# Patient Record
Sex: Female | Born: 1950 | Race: White | Hispanic: No
Health system: Southern US, Community
[De-identification: ages and names within clinical notes are randomized; demographics above are authoritative.]

---

## 2004-02-12 ENCOUNTER — Ambulatory Visit (HOSPITAL_BASED_OUTPATIENT_CLINIC_OR_DEPARTMENT_OTHER): Admission: RE | Admit: 2004-02-12 | Discharge: 2004-02-12 | Payer: Self-pay | Admitting: Orthopedic Surgery

## 2006-04-03 ENCOUNTER — Ambulatory Visit: Payer: Self-pay | Admitting: Family Medicine

## 2006-11-26 ENCOUNTER — Ambulatory Visit: Payer: Self-pay | Admitting: Family Medicine

## 2007-05-02 ENCOUNTER — Ambulatory Visit: Payer: Self-pay

## 2008-01-21 ENCOUNTER — Emergency Department: Payer: Self-pay | Admitting: Emergency Medicine

## 2008-02-07 ENCOUNTER — Ambulatory Visit: Payer: Self-pay | Admitting: Urology

## 2008-02-13 ENCOUNTER — Ambulatory Visit: Payer: Self-pay | Admitting: Urology

## 2008-03-04 ENCOUNTER — Ambulatory Visit: Payer: Self-pay | Admitting: Family Medicine

## 2008-06-24 ENCOUNTER — Ambulatory Visit: Payer: Self-pay

## 2009-08-21 ENCOUNTER — Emergency Department: Payer: Self-pay | Admitting: Unknown Physician Specialty

## 2009-08-24 IMAGING — CR RIGHT ANKLE - COMPLETE 3+ VIEW
1 series · 5 of 5 positions shown · non-contrast
Comparison: none

REASON FOR EXAM: Pain
COMMENTS:

PROCEDURE:     KDR - KDXR ANKLE RIGHT COMPLETE  - March 04, 2008  [DATE]
RESULT:     Images of the right ankle demonstrate no definite fracture,
dislocation or radiopaque foreign body. If there is concern for occult
fracture, repeat imaging would be recommended in 7 to 10 days.

[Series 2: view not recorded · 0.17mm/px · 5 of 5 slices shown]
[im 1/5]
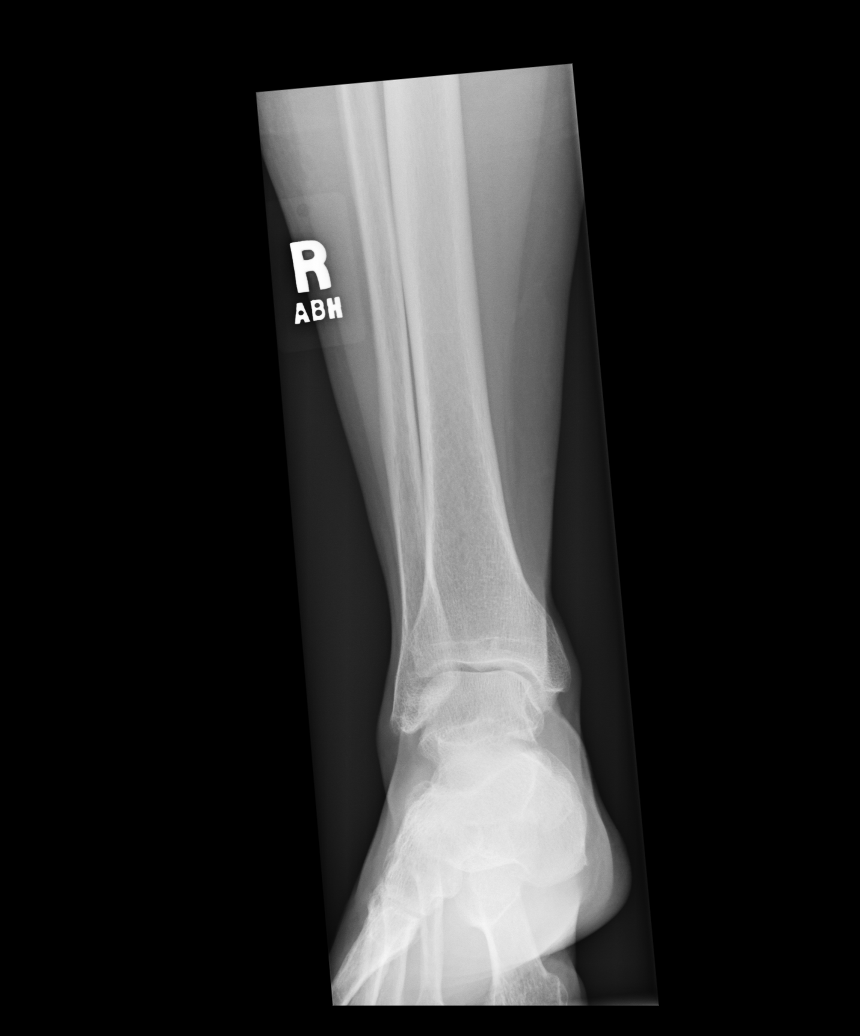
[im 2/5]
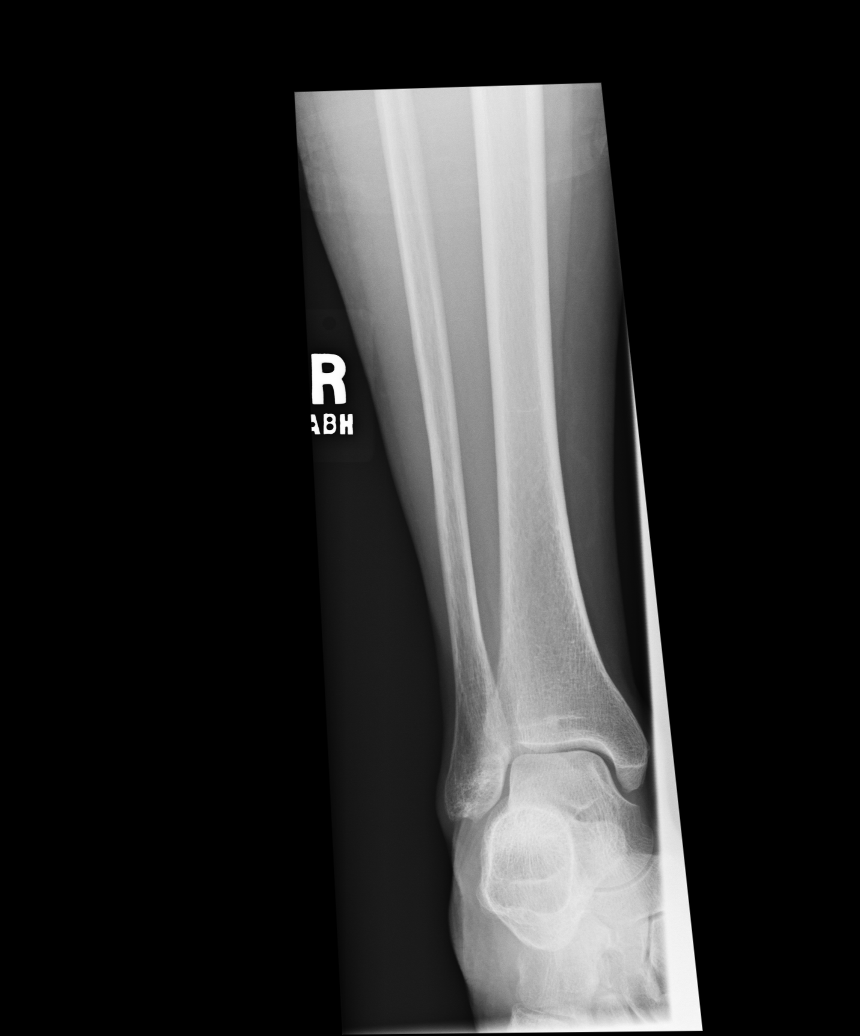
[im 3/5]
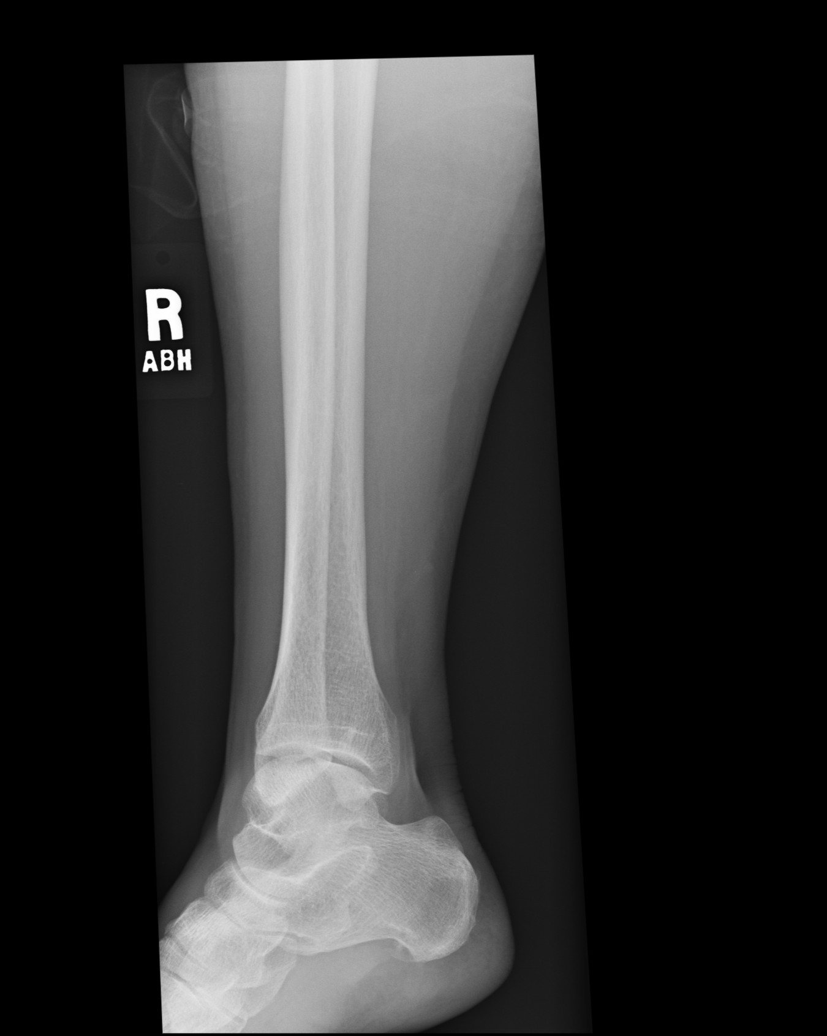
[im 4/5]
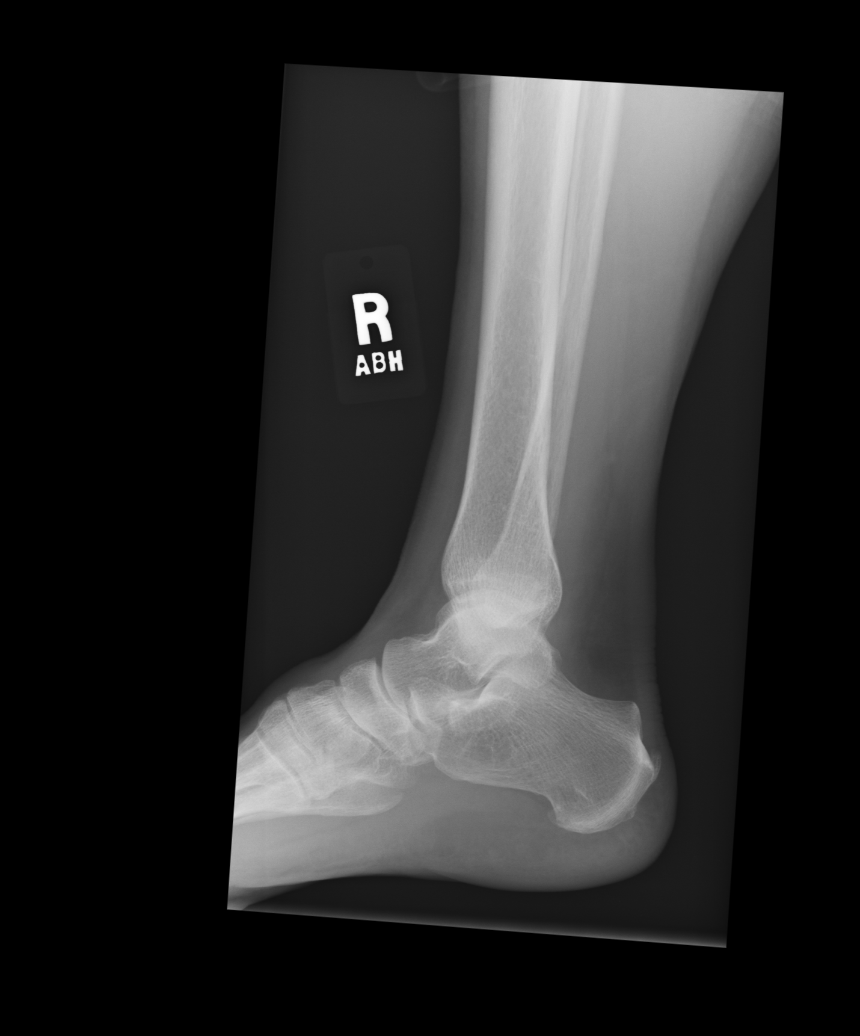
[im 5/5]
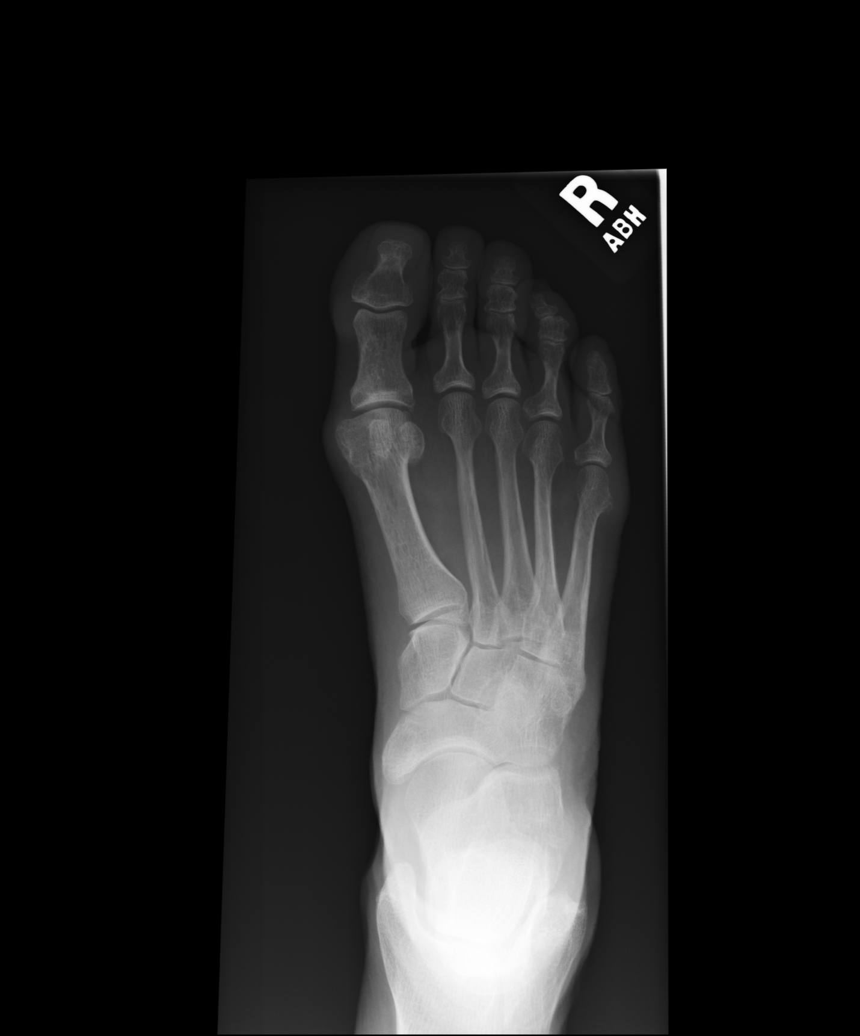

[5 of 5 positions shown; findings below may reference images not displayed]

IMPRESSION: No acute bony abnormality evident.

## 2009-09-03 ENCOUNTER — Emergency Department: Payer: Self-pay | Admitting: Emergency Medicine

## 2009-09-15 ENCOUNTER — Ambulatory Visit: Payer: Self-pay | Admitting: Family Medicine

## 2009-10-19 ENCOUNTER — Emergency Department: Payer: Self-pay | Admitting: Emergency Medicine

## 2009-11-11 ENCOUNTER — Ambulatory Visit: Payer: Self-pay | Admitting: Family Medicine

## 2010-05-29 ENCOUNTER — Emergency Department: Payer: Self-pay | Admitting: Emergency Medicine

## 2010-12-01 ENCOUNTER — Ambulatory Visit: Payer: Self-pay | Admitting: Family Medicine

## 2011-08-22 ENCOUNTER — Emergency Department: Payer: Self-pay | Admitting: *Deleted

## 2012-06-05 ENCOUNTER — Ambulatory Visit: Payer: Self-pay | Admitting: Family Medicine

## 2012-08-20 ENCOUNTER — Emergency Department: Payer: Self-pay | Admitting: Emergency Medicine

## 2013-12-30 ENCOUNTER — Emergency Department: Payer: Self-pay | Admitting: Emergency Medicine

## 2014-03-17 ENCOUNTER — Ambulatory Visit: Payer: Self-pay | Admitting: Family Medicine

## 2014-07-29 DEATH — deceased

## 2014-09-02 ENCOUNTER — Telehealth: Payer: Self-pay | Admitting: Family Medicine

## 2014-09-02 NOTE — Telephone Encounter (Signed)
Pt's son called to advise that pt passed away in May of 2016. TNP
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
